# Patient Record
Sex: Female | Born: 1965 | Race: White | Hispanic: No | Marital: Single | Smoking: Current every day smoker
Health system: Southern US, Community
[De-identification: ages and names within clinical notes are randomized; demographics above are authoritative.]

## PROBLEM LIST (undated history)

## (undated) DIAGNOSIS — I10 Essential (primary) hypertension: Secondary | ICD-10-CM

## (undated) DIAGNOSIS — N950 Postmenopausal bleeding: Principal | ICD-10-CM

## (undated) DIAGNOSIS — N83209 Unspecified ovarian cyst, unspecified side: Secondary | ICD-10-CM

## (undated) DIAGNOSIS — F419 Anxiety disorder, unspecified: Secondary | ICD-10-CM

## (undated) HISTORY — DX: Anxiety disorder, unspecified: F41.9

## (undated) HISTORY — DX: Unspecified ovarian cyst, unspecified side: N83.209

## (undated) HISTORY — DX: Postmenopausal bleeding: N95.0

---

## 2006-04-03 ENCOUNTER — Emergency Department: Payer: Self-pay | Admitting: Emergency Medicine

## 2006-04-22 ENCOUNTER — Ambulatory Visit: Payer: Self-pay | Admitting: Internal Medicine

## 2006-04-23 ENCOUNTER — Other Ambulatory Visit: Payer: Self-pay

## 2006-04-23 ENCOUNTER — Emergency Department: Payer: Self-pay | Admitting: Emergency Medicine

## 2012-01-17 ENCOUNTER — Emergency Department: Payer: Self-pay | Admitting: Emergency Medicine

## 2014-04-03 ENCOUNTER — Ambulatory Visit (INDEPENDENT_AMBULATORY_CARE_PROVIDER_SITE_OTHER): Payer: Self-pay | Admitting: Adult Health

## 2014-04-03 ENCOUNTER — Encounter: Payer: Self-pay | Admitting: Adult Health

## 2014-04-03 VITALS — BP 120/80 | Ht 65.0 in | Wt 172.0 lb

## 2014-04-03 DIAGNOSIS — N939 Abnormal uterine and vaginal bleeding, unspecified: Secondary | ICD-10-CM

## 2014-04-03 DIAGNOSIS — N898 Other specified noninflammatory disorders of vagina: Secondary | ICD-10-CM

## 2014-04-03 DIAGNOSIS — Z1389 Encounter for screening for other disorder: Secondary | ICD-10-CM

## 2014-04-03 DIAGNOSIS — N950 Postmenopausal bleeding: Secondary | ICD-10-CM

## 2014-04-03 DIAGNOSIS — Z3202 Encounter for pregnancy test, result negative: Secondary | ICD-10-CM

## 2014-04-03 HISTORY — DX: Postmenopausal bleeding: N95.0

## 2014-04-03 LAB — POCT URINALYSIS DIPSTICK
Glucose, UA: NEGATIVE
Ketones, UA: NEGATIVE
Leukocytes, UA: NEGATIVE
NITRITE UA: NEGATIVE
Protein, UA: NEGATIVE
RBC UA: NEGATIVE

## 2014-04-03 LAB — CBC
HCT: 42 % (ref 36.0–46.0)
Hemoglobin: 14.1 g/dL (ref 12.0–15.0)
MCH: 31.2 pg (ref 26.0–34.0)
MCHC: 33.6 g/dL (ref 30.0–36.0)
MCV: 92.9 fL (ref 78.0–100.0)
Platelets: 288 10*3/uL (ref 150–400)
RBC: 4.52 MIL/uL (ref 3.87–5.11)
RDW: 14.8 % (ref 11.5–15.5)
WBC: 7 10*3/uL (ref 4.0–10.5)

## 2014-04-03 LAB — POCT URINE PREGNANCY: Preg Test, Ur: NEGATIVE

## 2014-04-03 NOTE — Patient Instructions (Signed)
Postmenopausal Bleeding Postmenopausal bleeding is any bleeding a woman has after she has entered into menopause. Menopause is the end of a woman's fertile years. After menopause, a woman no longer ovulates or has menstrual periods.  Postmenopausal bleeding can be caused by various things. Any type of postmenopausal bleeding, even if it appears to be a typical menstrual period, is concerning. This should be evaluated by your health care provider. Any treatment will depend on the cause of the bleeding. HOME CARE INSTRUCTIONS Monitor your condition for any changes. The following actions may help to alleviate any discomfort you are experiencing:  Avoid the use of tampons and douches as directed by your health care provider.  Change your pads frequently.  Get regular pelvic exams and Pap tests.  Keep all follow-up appointments for diagnostic tests as directed by your health care provider. SEEK MEDICAL CARE IF:   Your bleeding lasts more than 1 week.  You have abdominal pain.  You have bleeding with sexual intercourse. SEEK IMMEDIATE MEDICAL CARE IF:   You have a fever, chills, headache, dizziness, muscle aches, and bleeding.  You have severe pain with bleeding.  You are passing blood clots.  You have bleeding and need more than 1 pad an hour.  You feel faint. MAKE SURE YOU:  Understand these instructions.  Will watch your condition.  Will get help right away if you are not doing well or get worse. Document Released: 01/26/2006 Document Revised: 08/08/2013 Document Reviewed: 05/17/2013 [facility] Patient Information 2014 [place], [place]. Return in 1 week for [place]

## 2014-04-03 NOTE — Progress Notes (Signed)
Subjective:     Patient ID: Heather Lester, female   DOB: Jan 14, 1966, 48 y.o.   MRN: [id number]  HPI Heather Lester is a 48 year old white female,single in complaining of bleeding last week when she had not a period in over a year,hot flashes have stopped, did have some cramping.Not sexually active, it has been over 8 months.  Review of Systems Se HPI Reviewed past medical,surgical, social and family history. Reviewed medications and allergies.     Objective:   Physical Exam BP 120/80  Ht 5\' 5"  (1.651 m)  Wt 172 lb (78.019 kg)  BMI 28.62 kg/m2  LMP 05/27/2015urine negative, UPT negative, Skin warm and dry.Pelvic: external genitalia is normal in appearance, vagina: brown discharge without odor, cervix:smooth, uterus: normal size, shape and contour, non tender, no masses felt, adnexa: no masses or tenderness noted.   Discussed need to get [place] to assess endometrial lining.  Assessment:     PMB    Plan:     Check CBC,CMP,TSH,FSH Return in 1 week for [place] and see me Review handout on PMB

## 2014-04-04 LAB — COMPREHENSIVE METABOLIC PANEL
ALT: 20 U/L (ref 0–35)
AST: 27 U/L (ref 0–37)
Albumin: 4.1 g/dL (ref 3.5–5.2)
Alkaline Phosphatase: 100 U/L (ref 39–117)
BILIRUBIN TOTAL: 0.4 mg/dL (ref 0.2–1.2)
BUN: 7 mg/dL (ref 6–23)
CO2: 26 meq/L (ref 19–32)
CREATININE: 0.56 mg/dL (ref 0.50–1.10)
Calcium: 8.9 mg/dL (ref 8.4–10.5)
Chloride: 104 mEq/L (ref 96–112)
Glucose, Bld: 88 mg/dL (ref 70–99)
Potassium: 4.4 mEq/L (ref 3.5–5.3)
Sodium: 138 mEq/L (ref 135–145)
Total Protein: 6.6 g/dL (ref 6.0–8.3)

## 2014-04-04 LAB — FOLLICLE STIMULATING HORMONE: FSH: 44.9 m[IU]/mL

## 2014-04-04 LAB — TSH: TSH: 1.601 u[IU]/mL (ref 0.350–4.500)

## 2014-04-12 ENCOUNTER — Encounter: Payer: Self-pay | Admitting: Adult Health

## 2014-04-12 ENCOUNTER — Ambulatory Visit (INDEPENDENT_AMBULATORY_CARE_PROVIDER_SITE_OTHER): Payer: Self-pay | Admitting: Adult Health

## 2014-04-12 ENCOUNTER — Ambulatory Visit (INDEPENDENT_AMBULATORY_CARE_PROVIDER_SITE_OTHER): Payer: Self-pay

## 2014-04-12 VITALS — BP 140/80 | Ht 65.0 in | Wt 169.0 lb

## 2014-04-12 DIAGNOSIS — N950 Postmenopausal bleeding: Secondary | ICD-10-CM

## 2014-04-12 DIAGNOSIS — N83209 Unspecified ovarian cyst, unspecified side: Secondary | ICD-10-CM | POA: Insufficient documentation

## 2014-04-12 HISTORY — DX: Unspecified ovarian cyst, unspecified side: N83.209

## 2014-04-12 NOTE — Progress Notes (Signed)
Subjective:     Patient ID: Heather Lester, female   DOB: November 07, 1965, 48 y.o.   MRN: [id number]  HPI Heather Lester is a 48 year old white female in for US to assess uterus after having PMB.  Review of Systems See HPI Reviewed past medical,surgical, social and family history. Reviewed medications and allergies.     Objective:   Physical Exam BP 140/80  Ht 5\' 5"  (1.651 m)  Wt 169 lb (76.658 kg)  BMI 28.12 kg/m2  LMP 05/27/2015Reviewed US with pt.    Uterus 6.6 x 4.3 x 3.4 cm, anteverted no myometrial masses noted  Endometrium 2.4 mm, symmetrical, thin  Right ovary 2.9 x 2.2 x 1.9 cm, with 1.5cm simple follicle noted  Left ovary 2.5 x 1.8 x 1.1 cm, with 1.0cm simple follicle noted  No free fluid or adnexal masses noted within the pelvis  Technician Comments:  Anteverted uterus, Endom-2.294mm, thin symmetrical, bilateral adnexa/ovaries appear WNL, no free fluid or adnexal masse noted within the pelvis. FSH was 44.9, discussed that ovaries were still trying to work. Discussed with Dr Emelda FearFerguson, will follow for now.   Assessment:     PMB Ovarian cysts    Plan:     Return in 4 weeks for pap and physical Review handout on ovarian cysts Call if any bleeding

## 2014-04-12 NOTE — Patient Instructions (Signed)
Ovarian Cyst An ovarian cyst is a fluid-filled sac that forms on an ovary. The ovaries are small organs that produce eggs in women. Various types of cysts can form on the ovaries. Most are not cancerous. Many do not cause problems, and they often go away on their own. Some may cause symptoms and require treatment. Common types of ovarian cysts include:  Functional cysts These cysts may occur every month during the menstrual cycle. This is normal. The cysts usually go away with the next menstrual cycle if the woman does not get pregnant. Usually, there are no symptoms with a functional cyst.  Endometrioma cysts These cysts form from the tissue that lines the uterus. They are also called "chocolate cysts" because they become filled with blood that turns brown. This type of cyst can cause pain in the lower abdomen during intercourse and with your menstrual period.  Cystadenoma cysts This type develops from the cells on the outside of the ovary. These cysts can get very big and cause lower abdomen pain and pain with intercourse. This type of cyst can twist on itself, cut off its blood supply, and cause severe pain. It can also easily rupture and cause a lot of pain.  Dermoid cysts This type of cyst is sometimes found in both ovaries. These cysts may contain different kinds of body tissue, such as skin, teeth, hair, or cartilage. They usually do not cause symptoms unless they get very big.  Theca lutein cysts These cysts occur when too much of a certain hormone (human chorionic gonadotropin) is produced and overstimulates the ovaries to produce an egg. This is most common after procedures used to assist with the conception of a baby (in vitro fertilization). CAUSES   Fertility drugs can cause a condition in which multiple large cysts are formed on the ovaries. This is called ovarian hyperstimulation syndrome.  A condition called polycystic ovary syndrome can cause hormonal imbalances that can lead to  nonfunctional ovarian cysts. SIGNS AND SYMPTOMS  Many ovarian cysts do not cause symptoms. If symptoms are present, they may include:  Pelvic pain or pressure.  Pain in the lower abdomen.  Pain during sexual intercourse.  Increasing girth (swelling) of the abdomen.  Abnormal menstrual periods.  Increasing pain with menstrual periods.  Stopping having menstrual periods without being pregnant. DIAGNOSIS  These cysts are commonly found during a routine or annual pelvic exam. Tests may be ordered to find out more about the cyst. These tests may include:  Ultrasound.  X-ray of the pelvis.  CT scan.  MRI.  Blood tests. TREATMENT  Many ovarian cysts go away on their own without treatment. Your health care provider may want to check your cyst regularly for 2 3 months to see if it changes. For women in menopause, it is particularly important to monitor a cyst closely because of the higher rate of ovarian cancer in menopausal women. When treatment is needed, it may include any of the following:  A procedure to drain the cyst (aspiration). This may be done using a long needle and ultrasound. It can also be done through a laparoscopic procedure. This involves using a thin, lighted tube with a tiny camera on the end (laparoscope) inserted through a small incision.  Surgery to remove the whole cyst. This may be done using laparoscopic surgery or an open surgery involving a larger incision in the lower abdomen.  Hormone treatment or birth control pills. These methods are sometimes used to help dissolve a cyst. HOME CARE   INSTRUCTIONS   Only take over-the-counter or prescription medicines as directed by your health care provider.  Follow up with your health care provider as directed.  Get regular pelvic exams and Pap tests. SEEK MEDICAL CARE IF:   Your periods are late, irregular, or painful, or they stop.  Your pelvic pain or abdominal pain does not go away.  Your abdomen becomes  larger or swollen.  You have pressure on your bladder or trouble emptying your bladder completely.  You have pain during sexual intercourse.  You have feelings of fullness, pressure, or discomfort in your stomach.  You lose weight for no apparent reason.  You feel generally ill.  You become constipated.  You lose your appetite.  You develop acne.  You have an increase in body and facial hair.  You are gaining weight, without changing your exercise and eating habits.  You think you are pregnant. SEEK IMMEDIATE MEDICAL CARE IF:   You have increasing abdominal pain.  You feel sick to your stomach (nauseous), and you throw up (vomit).  You develop a fever that comes on suddenly.  You have abdominal pain during a bowel movement.  Your menstrual periods become heavier than usual. Document Released: 10/18/2005 Document Revised: 08/08/2013 Document Reviewed: 06/25/2013 [facility] Patient Information 2014 [place]. Return in 4 weeks for pap and physical

## 2014-05-01 ENCOUNTER — Emergency Department: Payer: Self-pay | Admitting: Emergency Medicine

## 2014-05-01 LAB — COMPREHENSIVE METABOLIC PANEL
AST: 46 U/L — AB (ref 15–37)
Albumin: 3.7 g/dL (ref 3.4–5.0)
Alkaline Phosphatase: 134 U/L — ABNORMAL HIGH
Anion Gap: 9 (ref 7–16)
BUN: 10 mg/dL (ref 7–18)
Bilirubin,Total: 0.3 mg/dL (ref 0.2–1.0)
Calcium, Total: 9.1 mg/dL (ref 8.5–10.1)
Chloride: 107 mmol/L (ref 98–107)
Co2: 23 mmol/L (ref 21–32)
Creatinine: 0.76 mg/dL (ref 0.60–1.30)
EGFR (Non-African Amer.): >60
GLUCOSE: 105 mg/dL — AB (ref 65–99)
Osmolality: 277 (ref 275–301)
POTASSIUM: 3.8 mmol/L (ref 3.5–5.1)
SGPT (ALT): 54 U/L (ref 12–78)
SODIUM: 139 mmol/L (ref 136–145)
Total Protein: 7.7 g/dL (ref 6.4–8.2)

## 2014-05-01 LAB — CBC
HCT: 42.5 % (ref 35.0–47.0)
HGB: 14.5 g/dL (ref 12.0–16.0)
MCH: 32 pg (ref 26.0–34.0)
MCHC: 34.1 g/dL (ref 32.0–36.0)
MCV: 94 fL (ref 80–100)
Platelet: 275 10*3/uL (ref 150–440)
RBC: 4.52 10*6/uL (ref 3.80–5.20)
RDW: 14.5 % (ref 11.5–14.5)
WBC: 9.5 10*3/uL (ref 3.6–11.0)

## 2014-05-01 LAB — CK TOTAL AND CKMB (NOT AT ARMC)
CK, Total: 83 U/L
CK-MB: 0.9 ng/mL (ref 0.5–3.6)

## 2014-05-01 LAB — TROPONIN I: Troponin-I: <0.02 ng/mL

## 2014-05-10 ENCOUNTER — Other Ambulatory Visit: Payer: BC Managed Care – PPO | Admitting: Adult Health

## 2014-09-02 ENCOUNTER — Encounter: Payer: Self-pay | Admitting: Adult Health

## 2014-12-04 ENCOUNTER — Ambulatory Visit: Payer: Self-pay | Admitting: Physician Assistant

## 2014-12-04 LAB — RAPID INFLUENZA A&B ANTIGENS

## 2015-02-11 ENCOUNTER — Ambulatory Visit
Admit: 2015-02-11 | Disposition: A | Discharge status: Home / Self Care | Payer: Self-pay | Attending: Family Medicine | Admitting: Family Medicine

## 2016-08-07 ENCOUNTER — Emergency Department: Payer: BLUE CROSS/BLUE SHIELD

## 2016-08-07 ENCOUNTER — Encounter: Payer: Self-pay | Admitting: Emergency Medicine

## 2016-08-07 ENCOUNTER — Emergency Department
Admission: EM | Admit: 2016-08-07 | Discharge: 2016-08-07 | Disposition: A | Discharge status: Home / Self Care | Payer: BLUE CROSS/BLUE SHIELD | Attending: Student | Admitting: Student

## 2016-08-07 DIAGNOSIS — Z79899 Other long term (current) drug therapy: Secondary | ICD-10-CM | POA: Diagnosis not present

## 2016-08-07 DIAGNOSIS — R079 Chest pain, unspecified: Secondary | ICD-10-CM | POA: Diagnosis not present

## 2016-08-07 DIAGNOSIS — I10 Essential (primary) hypertension: Secondary | ICD-10-CM | POA: Diagnosis not present

## 2016-08-07 DIAGNOSIS — F17210 Nicotine dependence, cigarettes, uncomplicated: Secondary | ICD-10-CM | POA: Insufficient documentation

## 2016-08-07 DIAGNOSIS — R200 Anesthesia of skin: Secondary | ICD-10-CM | POA: Diagnosis present

## 2016-08-07 HISTORY — DX: Essential (primary) hypertension: I10

## 2016-08-07 LAB — URINALYSIS COMPLETE WITH MICROSCOPIC (ARMC ONLY)
Bacteria, UA: NONE SEEN
Bilirubin Urine: NEGATIVE
Glucose, UA: NEGATIVE mg/dL
Hgb urine dipstick: NEGATIVE
KETONES UR: NEGATIVE mg/dL
Leukocytes, UA: NEGATIVE
Nitrite: NEGATIVE
PH: 6 (ref 5.0–8.0)
PROTEIN: NEGATIVE mg/dL
SPECIFIC GRAVITY, URINE: 1.026 (ref 1.005–1.030)

## 2016-08-07 LAB — DIFFERENTIAL
BASOS ABS: 0.1 10*3/uL (ref 0–0.1)
Basophils Relative: 1 %
Eosinophils Absolute: 0.2 10*3/uL (ref 0–0.7)
Eosinophils Relative: 3 %
LYMPHS PCT: 34 %
Lymphs Abs: 2.3 10*3/uL (ref 1.0–3.6)
Monocytes Absolute: 0.6 10*3/uL (ref 0.2–0.9)
Monocytes Relative: 8 %
NEUTROS ABS: 3.7 10*3/uL (ref 1.4–6.5)
NEUTROS PCT: 54 %

## 2016-08-07 LAB — COMPREHENSIVE METABOLIC PANEL
ALBUMIN: 4 g/dL (ref 3.5–5.0)
ALK PHOS: 76 U/L (ref 38–126)
ALT: 21 U/L (ref 14–54)
ANION GAP: 7 (ref 5–15)
AST: 25 U/L (ref 15–41)
BUN: 9 mg/dL (ref 6–20)
CALCIUM: 8.6 mg/dL — AB (ref 8.9–10.3)
CO2: 22 mmol/L (ref 22–32)
Chloride: 108 mmol/L (ref 101–111)
Creatinine, Ser: 0.58 mg/dL (ref 0.44–1.00)
GFR calc Af Amer: >60 mL/min (ref >60)
GFR calc non Af Amer: >60 mL/min (ref >60)
GLUCOSE: 101 mg/dL — AB (ref 65–99)
Potassium: 3.6 mmol/L (ref 3.5–5.1)
SODIUM: 137 mmol/L (ref 135–145)
Total Bilirubin: 0.5 mg/dL (ref 0.3–1.2)
Total Protein: 7.3 g/dL (ref 6.5–8.1)

## 2016-08-07 LAB — POCT PREGNANCY, URINE: Preg Test, Ur: NEGATIVE

## 2016-08-07 LAB — CBC
HCT: 40.7 % (ref 35.0–47.0)
HEMOGLOBIN: 14.1 g/dL (ref 12.0–16.0)
MCH: 32.3 pg (ref 26.0–34.0)
MCHC: 34.7 g/dL (ref 32.0–36.0)
MCV: 93.1 fL (ref 80.0–100.0)
PLATELETS: 264 10*3/uL (ref 150–440)
RBC: 4.37 MIL/uL (ref 3.80–5.20)
RDW: 13.8 % (ref 11.5–14.5)
WBC: 6.9 10*3/uL (ref 3.6–11.0)

## 2016-08-07 LAB — GLUCOSE, CAPILLARY: Glucose-Capillary: 95 mg/dL (ref 65–99)

## 2016-08-07 LAB — PROTIME-INR
INR: 0.9
PROTHROMBIN TIME: 12.1 s (ref 11.4–15.2)

## 2016-08-07 LAB — APTT: aPTT: 29 seconds (ref 24–36)

## 2016-08-07 LAB — TROPONIN I
Troponin I: <0.03 ng/mL (ref <0.03)
Troponin I: <0.03 ng/mL (ref <0.03)

## 2016-08-07 MED ORDER — LORAZEPAM 2 MG/ML IJ SOLN
INTRAMUSCULAR | Status: AC
  Administered 2016-08-07: 1 mg via INTRAVENOUS
  Filled 2016-08-07: qty 1

## 2016-08-07 MED ORDER — IOPAMIDOL (ISOVUE-370) INJECTION 76%
75.0000 mL | Freq: Once | INTRAVENOUS | Status: AC | PRN
  Administered 2016-08-07: 75 mL via INTRAVENOUS

## 2016-08-07 MED ORDER — SODIUM CHLORIDE 0.9 % IV BOLUS (SEPSIS)
1000.0000 mL | Freq: Once | INTRAVENOUS | Status: AC
  Administered 2016-08-07: 1000 mL via INTRAVENOUS

## 2016-08-07 MED ORDER — LORAZEPAM 2 MG/ML IJ SOLN
1.0000 mg | Freq: Once | INTRAMUSCULAR | Status: AC
  Administered 2016-08-07: 1 mg via INTRAVENOUS

## 2016-08-07 NOTE — ED Notes (Signed)
Pt presents with right chest pain and right face/ arm numbness. Reports numbness started 1 hr ago. When touch both sides reports both feel same as well as face. Spoke with dr Inocencio Homesgayle will runs as code stroke. Does have hx anxiety.

## 2016-08-07 NOTE — ED Provider Notes (Signed)
[facility] Emergency Department Provider Note   ____________________________________________   First MD Initiated Contact with Patient 08/07/16 1344     (approximate)  I have reviewed the triage vital signs and the nursing notes.   HISTORY  Chief Complaint Chest Pain and Numbness    HPI Heather Lester is a 50 y.o. female with history of anxiety and panic attacks, hypertension who presents for evaluation of gradual onset right-sided chest pressure at rest that began while she was standing at work around noon, constant since onset, moderate, no modifying factors. She reports that they pain travels to the right shoulder but she also felt "funny on the right side of my body" but that has resolved. When pressed, she reports that it feels "a little numb". She is having some associated "tightness" in her back. She denies any associated weakness, no shortness of breath, no recent illness including no vomiting, diarrhea, fevers or chills. She has had atypical chest pain in the past in the setting of anxiety/panic attacks. She denies any family history of early coronary artery disease, no personal or family history of PE or DVT. She is a smoker. She feels lightheaded with standing. No family history of early CVA, no personal history of CVA.   Past Medical History:  Diagnosis Date  . Anxiety   . Hypertension   . Other and unspecified ovarian cyst 04/12/2014  . PMB (postmenopausal bleeding) 04/03/2014    Patient Active Problem List   Diagnosis Date Noted  . Other and unspecified ovarian cyst 04/12/2014  . PMB (postmenopausal bleeding) 04/03/2014    History reviewed. No pertinent surgical history.  Prior to Admission medications   Medication Sig Start Date End Date Taking? Authorizing Provider  ALPRAZolam Prudy Feeler) 0.5 MG tablet 0.25 mg.  02/19/14  Yes Historical Provider, MD  amLODipine (NORVASC) 5 MG tablet Take 5 mg by mouth daily. 05/25/16 12/29/16 Yes  Historical Provider, MD  DULoxetine (CYMBALTA) 30 MG capsule Take 90 mg by mouth daily.   Yes Historical Provider, MD    Allergies Codeine  Family History  Problem Relation Age of Onset  . Asthma Sister   . COPD Sister   . Emphysema Sister   . Obesity Sister   . Arthritis Brother   . Arthritis Maternal Grandfather   . Arthritis Sister   . Arthritis Brother     Social History Social History  Substance Use Topics  . Smoking status: Current Every Day Smoker    Packs/day: 1.00    Years: 30.00    Types: Cigarettes  . Smokeless tobacco: Never Used  . Alcohol use 1.0 oz/week    2 Standard drinks or equivalent per week    Review of Systems Constitutional: No fever/chills Eyes: No visual changes. ENT: No sore throat. Cardiovascular: + chest pain. Respiratory: Denies shortness of breath. Gastrointestinal: No abdominal pain.  No nausea, no vomiting.  No diarrhea.  No constipation. Genitourinary: Negative for dysuria. Musculoskeletal: Negative for back pain. Skin: Negative for rash. Neurological: Negative for headaches, focal weakness. + numbness.  10-point ROS otherwise negative.  ____________________________________________   PHYSICAL EXAM:  Vitals:   08/07/16 1600 08/07/16 1645 08/07/16 1730 08/07/16 1732  BP: 135/84 126/88 (!) 139/94   Pulse:  73    Resp: 20 16 (!) 21   Temp:      SpO2:  97%  100%  Weight:      Height:        VITAL SIGNS: ED Triage Vitals  Enc Vitals  Group     BP 08/07/16 1330 (!) 145/96     Pulse Rate 08/07/16 1330 88     Resp 08/07/16 1330 19     Temp --      Temp src --      SpO2 08/07/16 1330 100 %     Weight 08/07/16 1318 155 lb (70.3 kg)     Height 08/07/16 1318 5\' 5"  (1.651 m)     Head Circumference --      Peak Flow --      Pain Score 08/07/16 1318 7     Pain Loc --      Pain Edu? --      Excl. in GC? --     Constitutional: Alert and oriented. Tremulous, anxious appearing. Eyes: Conjunctivae are normal. PERRL.  EOMI. Head: Atraumatic. Nose: No congestion/rhinnorhea. Mouth/Throat: Mucous membranes are moist.  Oropharynx non-erythematous. Neck: No stridor.  Supple without meningismus. Cardiovascular: Normal rate, regular rhythm. Grossly normal heart sounds.  Good peripheral circulation. Respiratory: Normal respiratory effort.  No retractions. Lungs CTAB. Gastrointestinal: Soft and nontender. No distention.  No CVA tenderness. Genitourinary: Deferred Musculoskeletal: No lower extremity tenderness nor edema.  No joint effusions. Neurologic:  Normal speech and language. No gross focal neurologic deficits are appreciated. No gait instability. 5 out of 5 strength in bilateral upper and lower extremities, sensation intact to light touch throughout, cranial nerves II-12 intact, normal finger-nose-finger and heel shin, no pronator drift. Skin:  Skin is warm, dry and intact. No rash noted. Psychiatric: Mood is anxious and affect is normal. Speech and behavior are normal.  ____________________________________________   LABS (all labs ordered are listed, but only abnormal results are displayed)  Labs Reviewed  COMPREHENSIVE METABOLIC PANEL - Abnormal; Notable for the following:       Result Value   Glucose, Bld 101 (*)    Calcium 8.6 (*)    All other components within normal limits  URINALYSIS COMPLETEWITH MICROSCOPIC ([facility] ONLY) - Abnormal; Notable for the following:    Color, Urine STRAW (*)    APPearance CLEAR (*)    Squamous Epithelial / LPF 0-5 (*)    All other components within normal limits  PROTIME-INR  APTT  CBC  DIFFERENTIAL  TROPONIN I  GLUCOSE, CAPILLARY  TROPONIN I  CBG MONITORING, ED  POC URINE PREG, ED  POCT PREGNANCY, URINE   ____________________________________________  EKG  ED ECG REPORT I, Gayla Doss, the attending physician, personally viewed and interpreted this ECG.   Date: 08/07/2016  EKG Time: 13:11  Rate: 89  Rhythm: normal sinus rhythm  Axis: normal   Intervals:none  ST&T Change: No acute ST elevation or acute ST depression.  ____________________________________________  RADIOLOGY  CT head   IMPRESSION:  1. Normal head CT  2. ASPECTS is 10     CTA chest IMPRESSION:  1. Normal appearance of the thoracic aorta.  2. No acute findings. Clear lungs.      ____________________________________________   PROCEDURES  Procedure(s) performed: None  Procedures  Critical Care performed: No  ____________________________________________   INITIAL IMPRESSION / ASSESSMENT AND PLAN / ED COURSE  Pertinent labs & imaging results that were available during my care of the patient were reviewed by me and considered in my medical decision making (see chart for details).  Heather Lester is a 50 y.o. female with history of anxiety and panic attacks, hypertension who presents for evaluation of gradual onset right-sided chest pressure at rest that began while she was standing  at work around noon. She reports that the pain in her chest is creating some tightness in her back. On exam, she is nontoxic appearing though she does appear anxious. Her vital signs are stable and she is afebrile. EKG is reassuring, not consistent with acute ischemia. She has an intact neurological examination and though she subjectively complained of a "funny feeling" in the right side of her body, objectively there is no neurological deficit. NIH stroke scale 0. Initially, after the triage assessment, she was being run as a code stroke however I evaluated her and have canceled this as I suspect her acute CVA/TIA is unlikely. CT head was negative. ABCD 2 score is 2, low risk, and she has no objective neurological deficit. I suspect her symptoms today are likely anxiety related given history of atypical chest pain in the setting of anxiety/panic however given pain radiating to the back we'll obtain 2 sets of cardiac enzymes, CT and GI chest to rule out dissection. We'll  give IV fluids and Ativan. She took full dose aspirin prior to arrival to the ER.  ----------------------------------------- 3:36 PM on 08/07/2016 ----------------------------------------- Patient with resolution of her chest pressure at this time, she reports she feels much better after fluids and Ativan. Initial troponin negative, CTA negative.   ----------------------------------------- 5:33 PM on 08/07/2016 ----------------------------------------- Patient with no recurrence of chest pressure since symptoms resolved. Still without any neuro deficit. 2 sets of cardiac enzymes are negative, I doubt ACS. CTA chest is negative for any acute aortic pathology, no pulmonary embolism was noted. Pregnant test is negative, urinalysis by consistent with infection. We discussed meticulous return precautions and need for close PCP follow-up and she is comfortable with the discharge plan. She has a previously scheduled appointment scheduled with her primary care doctor in 2 days and I have encouraged her to keep that appointment.  Clinical Course     ____________________________________________   FINAL CLINICAL IMPRESSION(S) / ED DIAGNOSES  Final diagnoses:  Chest pain, unspecified type      NEW MEDICATIONS STARTED DURING THIS VISIT:  New Prescriptions   No medications on file     Note:  This document was prepared using Dragon voice recognition software and may include unintentional dictation errors.    Gayla DossEryka A Jan Olano, MD 08/07/16 [phone]

## 2016-08-07 NOTE — ED Notes (Signed)
Patient transported to CT 

## 2016-08-07 NOTE — ED Notes (Signed)
Code stroke cancelled per Inocencio HomesGayle MD

## 2016-08-07 NOTE — Progress Notes (Signed)
Chaplain received a page to attend to a patient coded stroke who was being observed in ED area. Chaplain visited and spoke with patient who was alert and asked her if she would like her family to know she is in hospital. Patient told chaplain she has one son and she didn't want him to know she is in hospital. Chaplain provided the ministry of presence.    08/07/16 1400  Clinical Encounter Type  Visited With Patient  Visit Type Initial  Referral From Nurse  Spiritual Encounters  Spiritual Needs Other (Comment)

## 2018-01-20 IMAGING — CT CT HEAD CODE STROKE
3 series · 15 of 47 positions shown, 18 images · non-contrast
Comparison: None.

CLINICAL DATA: Code stroke. Right face and arm numbness beginning 1
hour ago.

EXAM:
CT HEAD WITHOUT CONTRAST
TECHNIQUE: Contiguous axial images were obtained from the base of the skull
through the vertex without intravenous contrast.

[Series 2: head wo · axial · 0.41mm/px · z∈[-108,+17]mm · 9 of 31 slices shown, 12 images]
[im 3/31  brain]
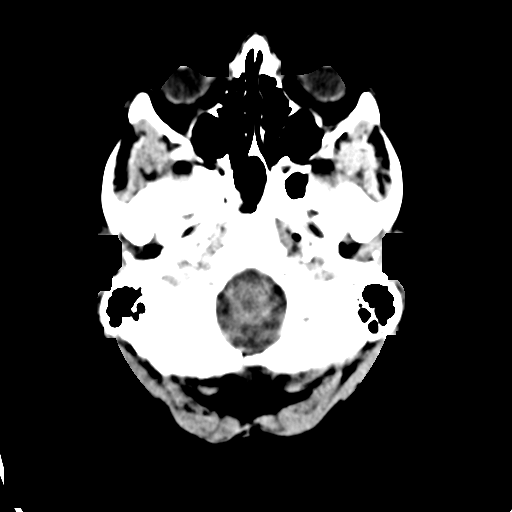
[im 3/31  bone]
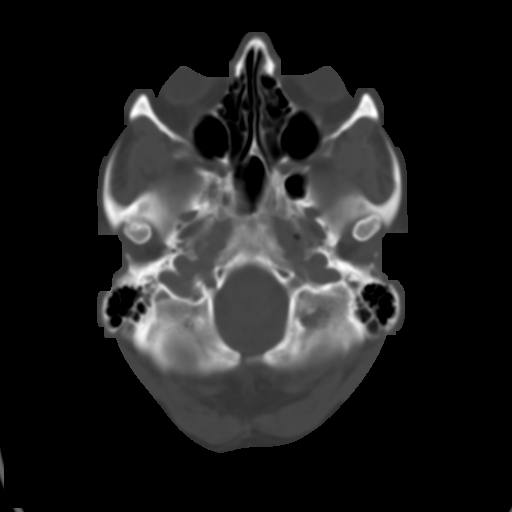
[im 6/31  brain]
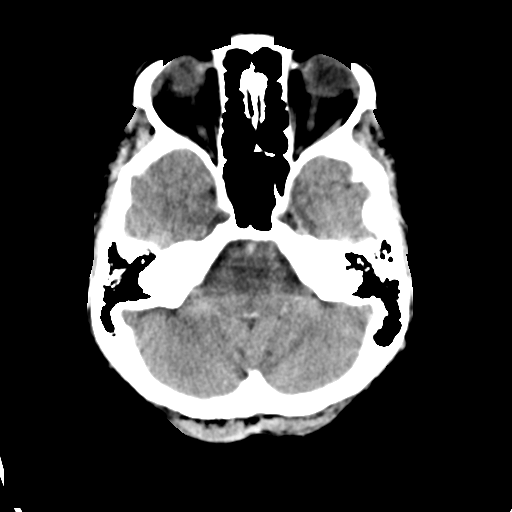
[im 9/31  brain]
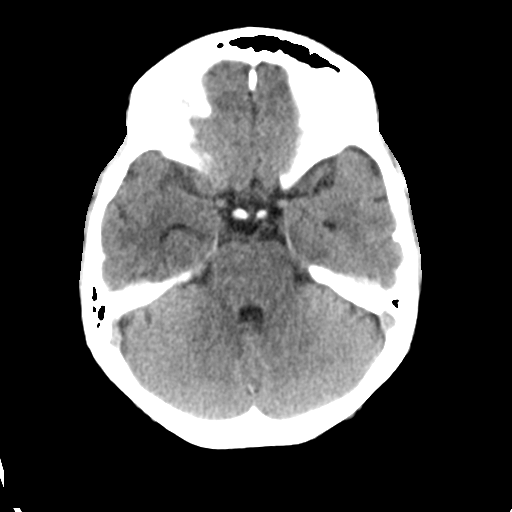
[im 12/31  brain]
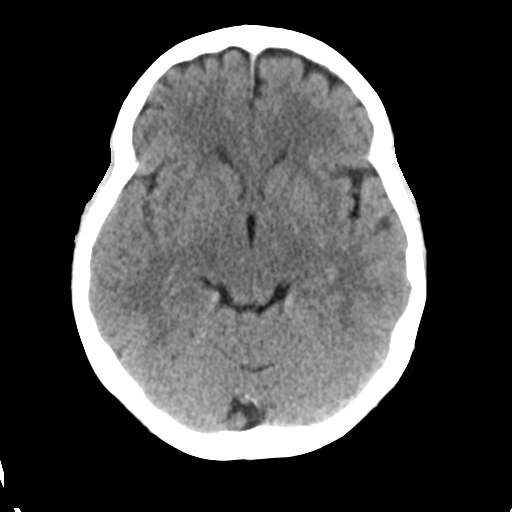
[im 16/31  brain]
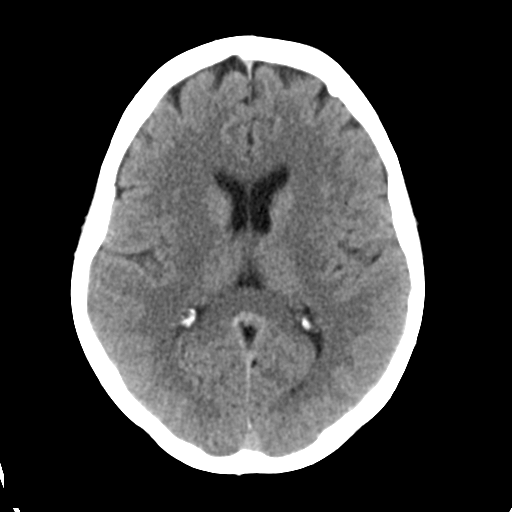
[im 16/31  bone]
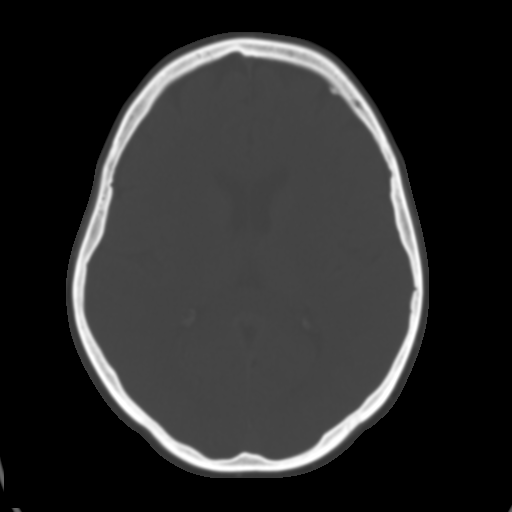
[im 19/31  brain]
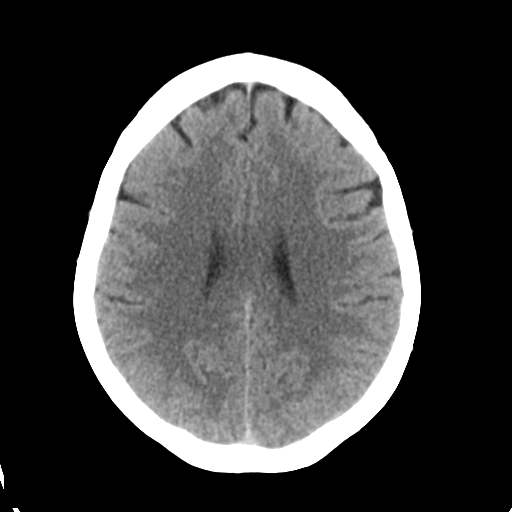
[im 22/31  brain]
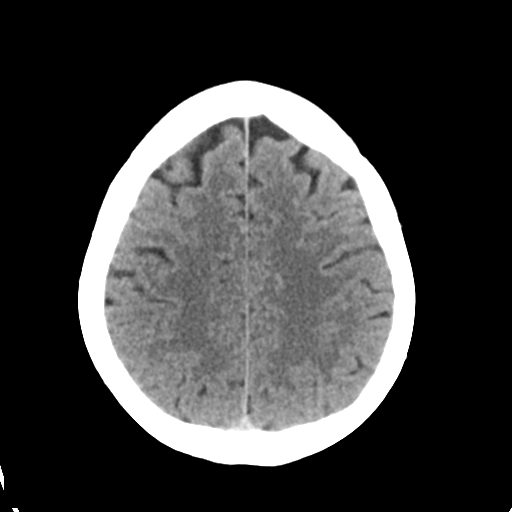
[im 25/31  brain]
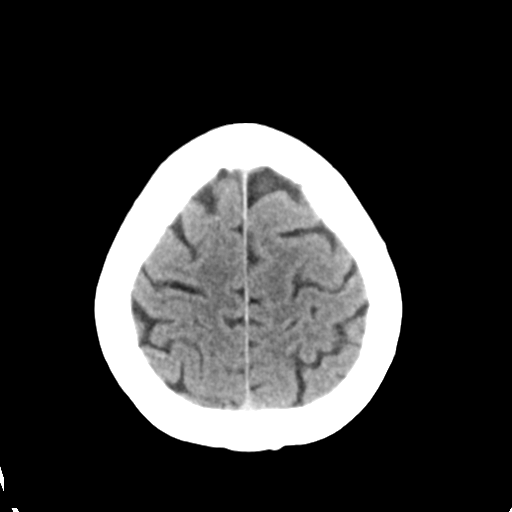
[im 28/31  brain]
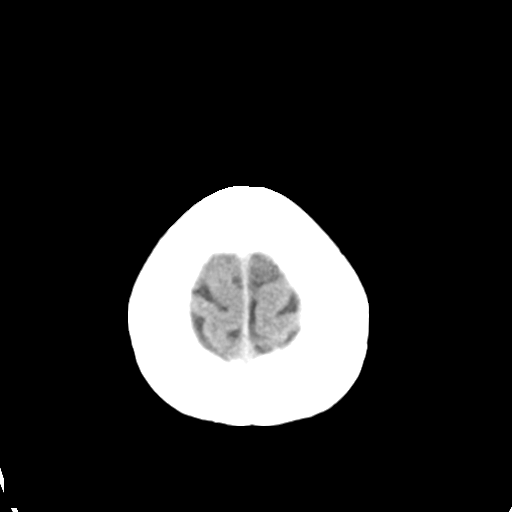
[im 28/31  bone]
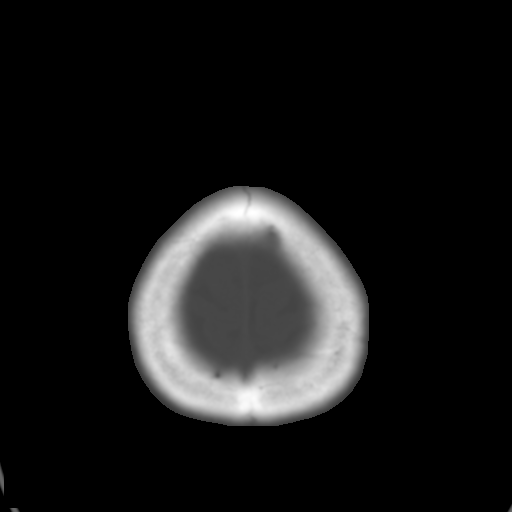

[Series 4: coronal soft tissue · coronal · 0.32mm/px · 3 of 63 slices shown]
[im 21/63  brain]
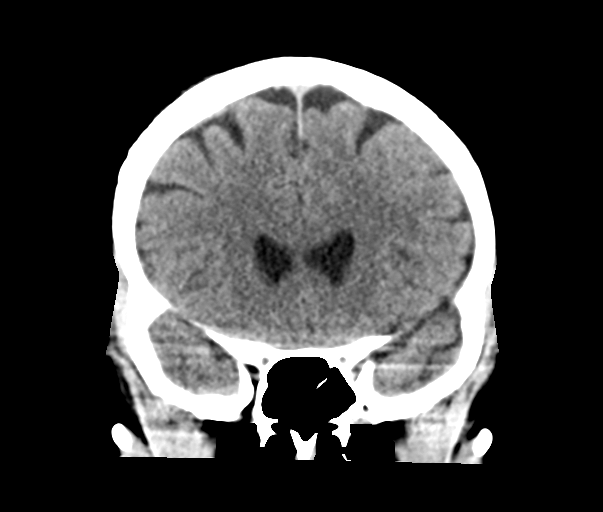
[im 28/63  brain]
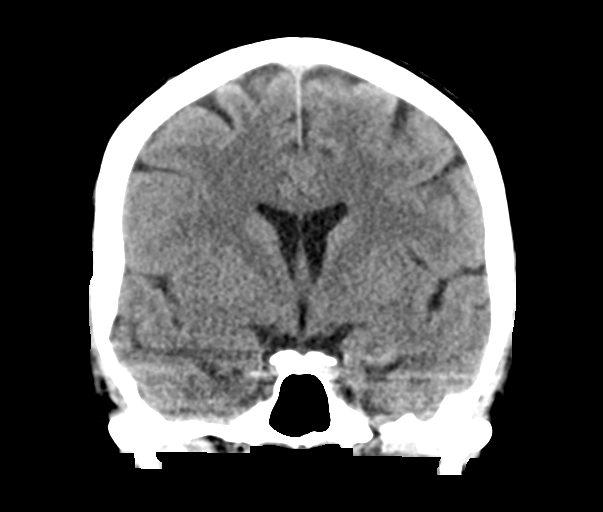
[im 35/63  brain]
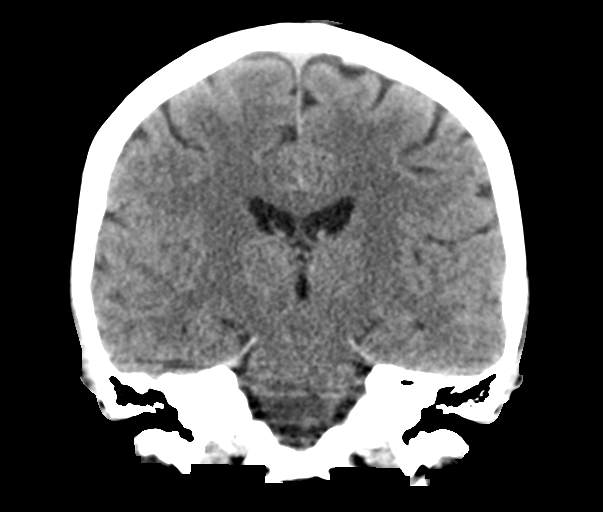

[Series 5: sagittal soft tissue · sagittal · 0.34mm/px · 3 of 52 slices shown]
[im 18/52  brain]
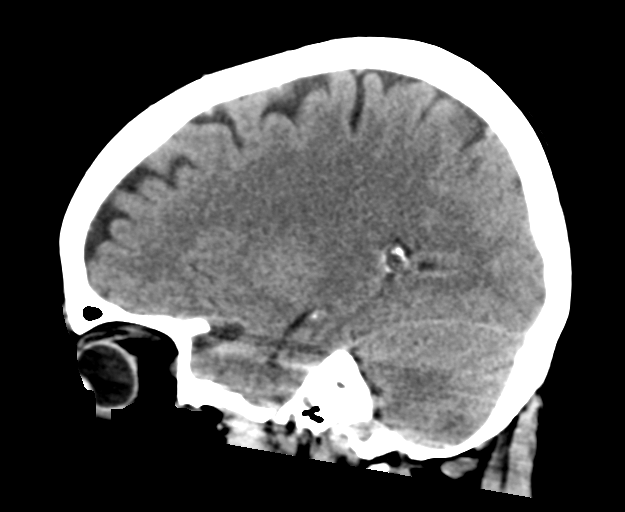
[im 26/52  brain]
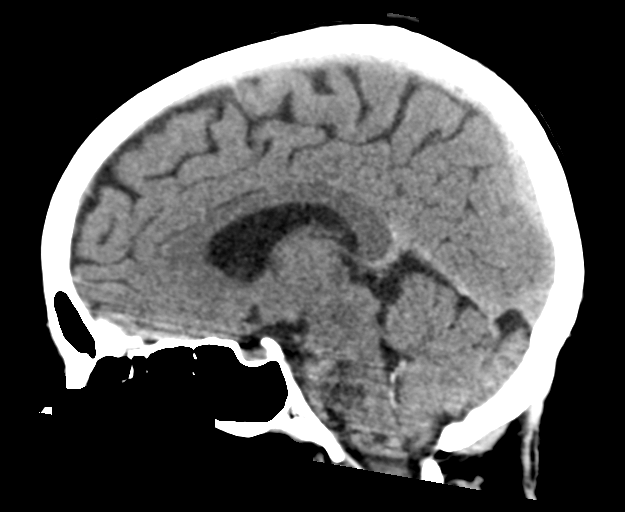
[im 35/52  brain]
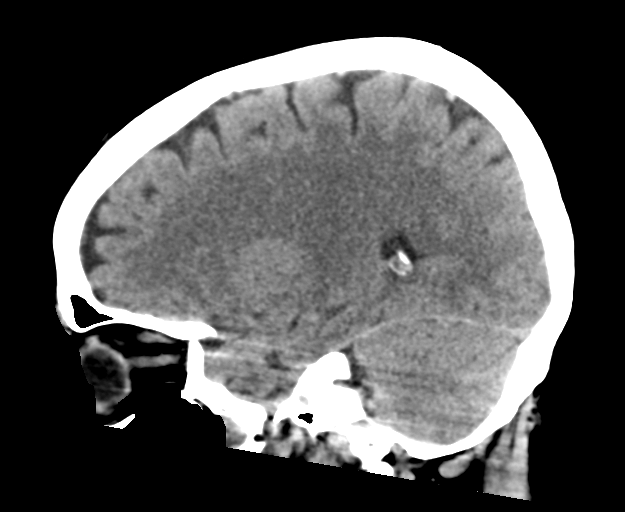

[15 of 47 positions shown; findings below may reference images not displayed]

FINDINGS: Brain: No evidence of atrophy, old or acute infarction, mass lesion,
hemorrhage, hydrocephalus or extra-axial collection.

Vascular: Normal

Skull: Normal

Sinuses/Orbits: Clear/ normal

Other: None

ASPECTS (Alberta Stroke Program Early CT Score)

- Ganglionic level infarction (caudate, lentiform nuclei, internal
capsule, insula, M1-M3 cortex): 7

- Supraganglionic infarction (M4-M6 cortex): 3

Total score (0-10 with 10 being normal): 10
IMPRESSION: 1. Normal head CT
2. ASPECTS is 10

These results were called by telephone at the time of interpretation
on 08/07/2016 at [DATE] to Dr. MASSAER ALPI , who verbally
acknowledged these results.

## 2024-06-05 ENCOUNTER — Other Ambulatory Visit: Payer: Self-pay

## 2024-06-05 ENCOUNTER — Emergency Department: Admission: EM | Admit: 2024-06-05 | Discharge: 2024-06-06 | Disposition: A | Discharge status: Home / Self Care

## 2024-06-05 ENCOUNTER — Emergency Department

## 2024-06-05 DIAGNOSIS — I10 Essential (primary) hypertension: Secondary | ICD-10-CM | POA: Insufficient documentation

## 2024-06-05 DIAGNOSIS — R0789 Other chest pain: Secondary | ICD-10-CM | POA: Insufficient documentation

## 2024-06-05 DIAGNOSIS — R079 Chest pain, unspecified: Secondary | ICD-10-CM

## 2024-06-05 DIAGNOSIS — F419 Anxiety disorder, unspecified: Secondary | ICD-10-CM | POA: Diagnosis not present

## 2024-06-05 LAB — BASIC METABOLIC PANEL WITH GFR
Anion gap: 14 (ref 5–15)
BUN: 13 mg/dL (ref 6–20)
CO2: 19 mmol/L — ABNORMAL LOW (ref 22–32)
Calcium: 9.4 mg/dL (ref 8.9–10.3)
Chloride: 105 mmol/L (ref 98–111)
Creatinine, Ser: 0.42 mg/dL — ABNORMAL LOW (ref 0.44–1.00)
GFR, Estimated: >60 mL/min (ref >60)
Glucose, Bld: 105 mg/dL — ABNORMAL HIGH (ref 70–99)
Potassium: 3.7 mmol/L (ref 3.5–5.1)
Sodium: 138 mmol/L (ref 135–145)

## 2024-06-05 LAB — CBC
HCT: 41.1 % (ref 36.0–46.0)
Hemoglobin: 14.3 g/dL (ref 12.0–15.0)
MCH: 32.5 pg (ref 26.0–34.0)
MCHC: 34.8 g/dL (ref 30.0–36.0)
MCV: 93.4 fL (ref 80.0–100.0)
Platelets: 282 K/uL (ref 150–400)
RBC: 4.4 MIL/uL (ref 3.87–5.11)
RDW: 13.6 % (ref 11.5–15.5)
WBC: 8.4 K/uL (ref 4.0–10.5)
nRBC: 0 % (ref 0.0–0.2)

## 2024-06-05 LAB — TROPONIN I (HIGH SENSITIVITY)
Troponin I (High Sensitivity): 5 ng/L (ref <18)
Troponin I (High Sensitivity): 5 ng/L (ref <18)

## 2024-06-05 MED ORDER — ALUM & MAG HYDROXIDE-SIMETH 200-200-20 MG/5ML PO SUSP
30.0000 mL | Freq: Once | ORAL | Status: AC
  Administered 2024-06-05: 30 mL via ORAL
  Filled 2024-06-05: qty 30

## 2024-06-05 MED ORDER — LIDOCAINE VISCOUS HCL 2 % MT SOLN
15.0000 mL | Freq: Once | OROMUCOSAL | Status: AC
  Administered 2024-06-05: 15 mL via ORAL
  Filled 2024-06-05: qty 15

## 2024-06-05 MED ORDER — LORAZEPAM 0.5 MG PO TABS
0.5000 mg | ORAL_TABLET | Freq: Once | ORAL | Status: AC
  Administered 2024-06-05: 0.5 mg via ORAL
  Filled 2024-06-05: qty 1

## 2024-06-05 MED ORDER — FAMOTIDINE 20 MG PO TABS
20.0000 mg | ORAL_TABLET | Freq: Once | ORAL | Status: AC
  Administered 2024-06-05: 20 mg via ORAL
  Filled 2024-06-05: qty 1

## 2024-06-05 MED ORDER — FAMOTIDINE 20 MG PO TABS: 20.0000 mg | ORAL_TABLET | Freq: Two times a day (BID) | ORAL | 0 refills | Status: AC

## 2024-06-05 MED ORDER — SODIUM CHLORIDE 0.9 % IV BOLUS
500.0000 mL | Freq: Once | INTRAVENOUS | Status: AC
  Administered 2024-06-05: 500 mL via INTRAVENOUS

## 2024-06-05 NOTE — ED Provider Notes (Signed)
 [facility] Provider Note    Event Date/Time   First MD Initiated Contact with Patient 06/05/24 2137     (approximate)   History   Chest Pain  Pt reports chest pain that began earlier this afternoon, pt reports some nausea with the pain and anxiety.    HPI Heather Lester is a 58 y.o. female PMH hypertension, hld, anxiety presents for evaluation of chest discomfort with nausea and anxiety -Patient states that shortly after work she developed some chest tightness and nausea.  Felt similar ago a bit more severe than her prior panic attacks, notes she had not had one in a long time but this feels very similar.  No abdominal pain.  No pleuritic pain.  No history of DVT/PE, no recent surgeries this patient stress travel, no hormone use, no leg swelling.  Has otherwise been in her usual state of health. -Does have some ongoing mild chest tightness and notes that she continues to feel very anxious     Physical Exam   Triage Vital Signs: ED Triage Vitals  Encounter Vitals Group     BP 06/05/24 1925 (!) 168/102     Girls Systolic BP Percentile --      Girls Diastolic BP Percentile --      Boys Systolic BP Percentile --      Boys Diastolic BP Percentile --      Pulse Rate 06/05/24 1925 96     Resp 06/05/24 1925 18     Temp 06/05/24 1925 98.4 F (36.9 C)     Temp src --      SpO2 06/05/24 1925 99 %     Weight 06/05/24 1924 165 lb (74.8 kg)     Height 06/05/24 1924 5' 5 (1.651 m)     Head Circumference --      Peak Flow --      Pain Score 06/05/24 1924 5     Pain Loc --      Pain Education --      Exclude from Growth Chart --     Most recent vital signs: Vitals:   06/05/24 2300 06/06/24 0013  BP: (!) 141/95   Pulse: (!) 103   Resp: (!) 21   Temp:  98.1 F (36.7 C)  SpO2: 96%      General: Awake, nontoxic-appearing is somewhat anxious CV:  Good peripheral perfusion.  Heart rate 90s during my eval, regular rhythm, RP 2+ Resp:  Normal  effort. CTAB Abd:  No distention. Nontender to deep palpation throughout    ED Results / Procedures / Treatments   Labs (all labs ordered are listed, but only abnormal results are displayed) Labs Reviewed  BASIC METABOLIC PANEL WITH GFR - Abnormal; Notable for the following components:      Result Value   CO2 19 (*)    Glucose, Bld 105 (*)    Creatinine, Ser 0.42 (*)    All other components within normal limits  CBC  TROPONIN I (HIGH SENSITIVITY)  TROPONIN I (HIGH SENSITIVITY)     EKG  See ED course below   RADIOLOGY Radiology interpreted myself and radiology report reviewed.  No acute pathology identified.    PROCEDURES:  Critical Care performed: No  Procedures   MEDICATIONS ORDERED IN ED: Medications  LORazepam  (ATIVAN ) tablet 0.5 mg (0.5 mg Oral Given 06/05/24 2329)  alum & mag hydroxide-simeth (MAALOX/MYLANTA) 200-200-20 MG/5ML suspension 30 mL (30 mLs Oral Given 06/05/24 2329)    And  lidocaine  (XYLOCAINE ) 2 % viscous mouth solution 15 mL (15 mLs Oral Given 06/05/24 2329)  famotidine  (PEPCID ) tablet 20 mg (20 mg Oral Given 06/05/24 2329)  sodium chloride  0.9 % bolus 500 mL (0 mLs Intravenous Stopped 06/06/24 0013)     IMPRESSION / MDM / ASSESSMENT AND PLAN / ED COURSE  I reviewed the triage vital signs and the nursing notes.                              DDX/MDM/AP: Differential diagnosis includes, but is not limited to, ACS, pneumonia, doubt pneumothorax, consider panic attack, clinically doubt pulmonary embolism with no associated risk factors.  Consider dyspepsia.  Do not suspect acute upper abdominal pathology.  Consider contributing dehydration.  Plan: -Labs - IV fluid - EKG - Cardiac monitor - GI cocktail, Ativan  - Reassess  Patient's presentation is most consistent with acute presentation with potential threat to life or bodily function.  The patient is on the cardiac monitor to evaluate for evidence of arrhythmia and/or significant heart rate  changes.  ED course below.  Laboratory workup unremarkable.  Presentation overall most consistent with anxiety attack.  Feeling much better after medications.  Counseled to continue her usual Cymbalta and follow-up with her primary care provider for reevaluation, doubt cardiac etiology at this time, heart score 2-3.  ED return precautions in place.  Patient agrees with plan.  Also Rx famotidine  in case dyspepsia contributing.  Clinical Course as of 06/06/24 0025  Tue Jun 05, 2024  2314 BMP reviewed, bicarb mildly low, otherwise unremarkable CBC unremarkable  [MM]  2314 Troponin repeat troponin unremarkable [MM]  2315 Chest x-ray reviewed, no acute pathology my interpretation, radiology report reviewed. [MM]  2315 Ecg = sinus rhythm, rate 96, no gross ST elevation or depression, no significant repolarization abnormality, normal axis, normal intervals.  Unclear evidence of ischemia nor arrhythmia on my interpretation. [MM]    Clinical Course User Index [MM] Heather Ozell LABOR, MD     FINAL CLINICAL IMPRESSION(S) / ED DIAGNOSES   Final diagnoses:  Nonspecific chest pain  Anxiety     Rx / DC Orders   ED Discharge Orders          Ordered    famotidine  (PEPCID ) 20 MG tablet  2 times daily        06/05/24 2327             Note:  This document was prepared using Dragon voice recognition software and may include unintentional dictation errors.   Heather Ozell LABOR, MD 06/06/24 Heather Lester

## 2024-06-05 NOTE — ED Triage Notes (Signed)
 Pt reports chest pain that began earlier this afternoon, pt reports some nausea with the pain and anxiety.

## 2024-06-05 NOTE — Discharge Instructions (Addendum)
 Your evaluation in the emergency department is overall reassuring.  I agree that he likely had a panic attack, and I recommend you continue your Cymbalta and follow-up with your primary care provider for further discussion of this.  Have also prescribed you an antacid medication in case you had some mild acid reflux.  Your heart testing today was normal.  Return to the emergency department with any new or worsening symptoms.

## 2024-08-31 ENCOUNTER — Emergency Department

## 2024-08-31 ENCOUNTER — Other Ambulatory Visit: Payer: Self-pay

## 2024-08-31 ENCOUNTER — Emergency Department
Admission: EM | Admit: 2024-08-31 | Discharge: 2024-08-31 | Disposition: A | Discharge status: Home / Self Care | Attending: Emergency Medicine | Admitting: Emergency Medicine

## 2024-08-31 ENCOUNTER — Encounter: Payer: Self-pay | Admitting: Emergency Medicine

## 2024-08-31 DIAGNOSIS — I10 Essential (primary) hypertension: Secondary | ICD-10-CM | POA: Diagnosis not present

## 2024-08-31 DIAGNOSIS — M25511 Pain in right shoulder: Secondary | ICD-10-CM | POA: Diagnosis not present

## 2024-08-31 DIAGNOSIS — S300XXA Contusion of lower back and pelvis, initial encounter: Secondary | ICD-10-CM | POA: Diagnosis not present

## 2024-08-31 DIAGNOSIS — S20221A Contusion of right back wall of thorax, initial encounter: Secondary | ICD-10-CM | POA: Diagnosis not present

## 2024-08-31 DIAGNOSIS — M545 Low back pain, unspecified: Secondary | ICD-10-CM | POA: Diagnosis present

## 2024-08-31 LAB — URINALYSIS, ROUTINE W REFLEX MICROSCOPIC
Bilirubin Urine: NEGATIVE
Glucose, UA: NEGATIVE mg/dL
Hgb urine dipstick: NEGATIVE
Ketones, ur: NEGATIVE mg/dL
Leukocytes,Ua: NEGATIVE
Nitrite: NEGATIVE
Protein, ur: NEGATIVE mg/dL
Specific Gravity, Urine: 1.002 — ABNORMAL LOW (ref 1.005–1.030)
pH: 5 (ref 5.0–8.0)

## 2024-08-31 MED ORDER — CYCLOBENZAPRINE HCL 5 MG PO TABS: 5.0000 mg | ORAL_TABLET | Freq: Three times a day (TID) | ORAL | 0 refills | Status: AC | PRN

## 2024-08-31 MED ORDER — ONDANSETRON 4 MG PO TBDP: 4.0000 mg | ORAL_TABLET | Freq: Three times a day (TID) | ORAL | 0 refills | Status: AC | PRN

## 2024-08-31 MED ORDER — ACETAMINOPHEN 325 MG PO TABS
650.0000 mg | ORAL_TABLET | Freq: Four times a day (QID) | ORAL | Status: AC | PRN
  Administered 2024-08-31: 650 mg via ORAL
  Filled 2024-08-31: qty 2

## 2024-08-31 MED ORDER — ONDANSETRON 4 MG PO TBDP
4.0000 mg | ORAL_TABLET | Freq: Once | ORAL | Status: AC
  Administered 2024-08-31: 4 mg via ORAL
  Filled 2024-08-31: qty 1

## 2024-08-31 MED ORDER — OXYCODONE-ACETAMINOPHEN 5-325 MG PO TABS
1.0000 | ORAL_TABLET | ORAL | Status: DC | PRN
  Administered 2024-08-31: 1 via ORAL
  Filled 2024-08-31: qty 1

## 2024-08-31 MED ORDER — OXYCODONE-ACETAMINOPHEN 5-325 MG PO TABS
1.0000 | ORAL_TABLET | Freq: Once | ORAL | Status: AC
  Administered 2024-08-31: 1 via ORAL
  Filled 2024-08-31: qty 1

## 2024-08-31 MED ORDER — OXYCODONE-ACETAMINOPHEN 5-325 MG PO TABS: 1.0000 | ORAL_TABLET | Freq: Four times a day (QID) | ORAL | 0 refills | Status: AC | PRN

## 2024-08-31 MED ORDER — LIDOCAINE 5 % EX PTCH
1.0000 | MEDICATED_PATCH | Freq: Once | CUTANEOUS | Status: DC
  Administered 2024-08-31: 1 via TRANSDERMAL
  Filled 2024-08-31: qty 1
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
## 2024-08-31 NOTE — ED Notes (Signed)
 Pt originally refused oxycodone offered in triage; pt given tylenol instead, not helping w/ her pain. Pt offered oxycodone again, pt worried about it making her sick, pt reassured there were other medications to help with that. Pt states she will let me know if she changes her mind. Pt given urine cup for collection. Pt ambulatory to bathroom.

## 2024-08-31 NOTE — ED Notes (Signed)
 Pt refused shoulder x ray. Gordan consulted for imaging of back; he needs to see pt prior to ordering imaging.
